# Patient Record
Sex: Female | Born: 1965 | Race: White | Hispanic: No | Marital: Married | Smoking: Current every day smoker
Health system: Southern US, Community
[De-identification: ages and names within clinical notes are randomized; demographics above are authoritative.]

## PROBLEM LIST (undated history)

## (undated) DIAGNOSIS — D649 Anemia, unspecified: Secondary | ICD-10-CM

## (undated) DIAGNOSIS — N939 Abnormal uterine and vaginal bleeding, unspecified: Secondary | ICD-10-CM

## (undated) HISTORY — PX: NO PAST SURGERIES: SHX2092

---

## 2002-10-23 ENCOUNTER — Other Ambulatory Visit: Admission: RE | Admit: 2002-10-23 | Discharge: 2002-10-23 | Payer: Self-pay | Admitting: Obstetrics and Gynecology

## 2003-06-13 ENCOUNTER — Other Ambulatory Visit: Admission: RE | Admit: 2003-06-13 | Discharge: 2003-06-13 | Payer: Self-pay | Admitting: Obstetrics and Gynecology

## 2003-12-20 ENCOUNTER — Inpatient Hospital Stay (HOSPITAL_COMMUNITY): Admission: AD | Admit: 2003-12-20 | Discharge: 2003-12-20 | Payer: Self-pay | Admitting: Obstetrics and Gynecology

## 2003-12-25 ENCOUNTER — Inpatient Hospital Stay (HOSPITAL_COMMUNITY): Admission: AD | Admit: 2003-12-25 | Discharge: 2003-12-28 | Payer: Self-pay | Admitting: Obstetrics and Gynecology

## 2010-11-10 ENCOUNTER — Emergency Department (HOSPITAL_COMMUNITY)
Admission: EM | Admit: 2010-11-10 | Discharge: 2010-11-10 | Disposition: A | Discharge status: Home / Self Care | Payer: No Typology Code available for payment source | Attending: Emergency Medicine | Admitting: Emergency Medicine

## 2010-11-10 ENCOUNTER — Emergency Department (HOSPITAL_COMMUNITY): Payer: No Typology Code available for payment source

## 2010-11-10 DIAGNOSIS — S139XXA Sprain of joints and ligaments of unspecified parts of neck, initial encounter: Secondary | ICD-10-CM | POA: Insufficient documentation

## 2010-11-10 DIAGNOSIS — S335XXA Sprain of ligaments of lumbar spine, initial encounter: Secondary | ICD-10-CM | POA: Insufficient documentation

## 2010-11-10 DIAGNOSIS — Y92410 Unspecified street and highway as the place of occurrence of the external cause: Secondary | ICD-10-CM | POA: Insufficient documentation

## 2010-11-10 DIAGNOSIS — R079 Chest pain, unspecified: Secondary | ICD-10-CM | POA: Insufficient documentation

## 2010-12-07 ENCOUNTER — Emergency Department (HOSPITAL_COMMUNITY)
Admission: EM | Admit: 2010-12-07 | Discharge: 2010-12-07 | Disposition: A | Discharge status: Home / Self Care | Payer: No Typology Code available for payment source | Attending: Emergency Medicine | Admitting: Emergency Medicine

## 2010-12-07 DIAGNOSIS — R51 Headache: Secondary | ICD-10-CM | POA: Insufficient documentation

## 2010-12-07 DIAGNOSIS — R079 Chest pain, unspecified: Secondary | ICD-10-CM | POA: Insufficient documentation

## 2010-12-07 DIAGNOSIS — T148XXA Other injury of unspecified body region, initial encounter: Secondary | ICD-10-CM | POA: Insufficient documentation

## 2010-12-07 DIAGNOSIS — M542 Cervicalgia: Secondary | ICD-10-CM | POA: Insufficient documentation

## 2010-12-07 DIAGNOSIS — M549 Dorsalgia, unspecified: Secondary | ICD-10-CM | POA: Insufficient documentation

## 2011-12-23 ENCOUNTER — Other Ambulatory Visit: Payer: Self-pay | Admitting: Infectious Diseases

## 2011-12-23 ENCOUNTER — Ambulatory Visit
Admission: RE | Admit: 2011-12-23 | Discharge: 2011-12-23 | Disposition: A | Discharge status: Home / Self Care | Payer: No Typology Code available for payment source | Source: Ambulatory Visit | Attending: Infectious Diseases | Admitting: Infectious Diseases

## 2011-12-23 DIAGNOSIS — R7611 Nonspecific reaction to tuberculin skin test without active tuberculosis: Secondary | ICD-10-CM

## 2012-06-28 ENCOUNTER — Encounter: Payer: Self-pay | Admitting: Medical

## 2012-11-06 ENCOUNTER — Other Ambulatory Visit: Payer: Self-pay

## 2012-11-06 DIAGNOSIS — Z1231 Encounter for screening mammogram for malignant neoplasm of breast: Secondary | ICD-10-CM

## 2012-11-12 ENCOUNTER — Ambulatory Visit
Admission: RE | Admit: 2012-11-12 | Discharge: 2012-11-12 | Disposition: A | Discharge status: Home / Self Care | Payer: BC Managed Care – PPO | Source: Ambulatory Visit

## 2012-11-12 DIAGNOSIS — Z1231 Encounter for screening mammogram for malignant neoplasm of breast: Secondary | ICD-10-CM

## 2012-11-15 ENCOUNTER — Ambulatory Visit (INDEPENDENT_AMBULATORY_CARE_PROVIDER_SITE_OTHER): Payer: BC Managed Care – PPO | Admitting: Family Medicine

## 2012-11-15 VITALS — BP 116/72 | HR 63 | Temp 97.8°F | Resp 18 | Ht 63.0 in | Wt 199.0 lb

## 2012-11-15 DIAGNOSIS — Z23 Encounter for immunization: Secondary | ICD-10-CM

## 2012-11-15 DIAGNOSIS — Z0000 Encounter for general adult medical examination without abnormal findings: Secondary | ICD-10-CM

## 2012-11-15 LAB — POCT CBC
Granulocyte percent: 60.2 %G (ref 37–80)
HCT, POC: 43.2 % (ref 37.7–47.9)
Hemoglobin: 13.6 g/dL (ref 12.2–16.2)
MCH, POC: 30.4 pg (ref 27–31.2)
MCV: 96.7 fL (ref 80–97)
RBC: 4.47 M/uL (ref 4.04–5.48)
WBC: 7.5 10*3/uL (ref 4.6–10.2)

## 2012-11-15 NOTE — Progress Notes (Signed)
Subjective:    Patient ID: Meredith Camacho, female    DOB: 09-02-1965, 47 y.o.   MRN: [id number]  HPI Drina Kroeger is a 47 y.o. female No regular primary care provider.  Here for physical.  Last physical about 7 years ago.  No known medical problems, no rx meds.  No specific concerns today.  Fasting this am, except coffee with some sugar free milk this am.  Mammogram negative 11/12/12.  Last tetanus unknown.  Will have done today.  Refuses flu shot - has never had, declines today.  Last pap 4/14 - normal at [facility].  Possible start of menopause earlier this year.   SH: custodian Building control surveyor. Smokes 5 cigarettes per day.  Not ready to quit. No alcohol. From [place] - in [place] for 18 years. 3 children, married.  No known FH of medical problems.  Both parents deceased.  History reviewed. No pertinent past medical history. History reviewed. No pertinent past surgical history. No Known Allergies Prior to Admission medications   Not on File   History   Social History  . Marital Status: Married    Spouse Name: N/A    Number of Children: N/A  . Years of Education: N/A   Occupational History  . Not on file.   Social History Main Topics  . Smoking status: Current Every Day Smoker    Types: Cigarettes  . Smokeless tobacco: Not on file  . Alcohol Use: No  . Drug Use: No  . Sexual Activity: Not on file   Other Topics Concern  . Not on file   Social History Narrative  . No narrative on file    Review of Systems 13 point review of systems per patient health survey noted. No positive responses      Objective:   Physical Exam  Vitals reviewed. Constitutional: She is oriented to person, place, and time. She appears well-developed and well-nourished.  HENT:  Head: Normocephalic and atraumatic.  Right Ear: External ear normal.  Left Ear: External ear normal.  Mouth/Throat: Oropharynx is clear and moist.  Eyes: Conjunctivae are normal. Pupils are equal, round,  and reactive to light.  Neck: Normal range of motion. Neck supple. No thyromegaly present.  Cardiovascular: Normal rate, regular rhythm, normal heart sounds and intact distal pulses.   No murmur heard. Pulmonary/Chest: Effort normal and breath sounds normal. No respiratory distress. She has no wheezes.  Abdominal: Soft. Bowel sounds are normal. There is no tenderness.  Musculoskeletal: Normal range of motion. She exhibits no edema and no tenderness.  Lymphadenopathy:    She has no cervical adenopathy.  Neurological: She is alert and oriented to person, place, and time.  Skin: Skin is warm and dry. No rash noted.  Psychiatric: She has a normal mood and affect. Her behavior is normal. Thought content normal.        Assessment & Plan:  Meredith Camacho is a 47 y.o. female Annual physical exam - Plan: Comprehensive metabolic panel, Lipid panel, TSH, POCT CBC  Need for prophylactic vaccination with combined diphtheria-tetanus-pertussis (DTP) vaccine annual exam. Up to date on MMG and PAP.  Labs above, tdap updated. Declined sti testing, declined flu vaccine - discussed concerns and common misconceptions, and recommended this as working in school. Smoking cessation recommended. Anticipatory guidance and resources in AVS.   Patient Instructions  You should receive a call or letter about your lab results within the next week to 10 days.  When you are ready to quit smoking -  [place] offers smoking cessation clinics. Registration is required. To register call [phone] or register online at [email or web address]   Keeping You Healthy  Get These Tests 1. Blood Pressure- Have your blood pressure checked once a year by your health care provider.  Normal blood pressure is 120/80. 2. Weight- Have your body mass index (BMI) calculated to screen for obesity.  BMI is measure of body fat based on height and weight.  You can also calculate your own BMI at [email or web address].west-esparza.com/. 3. Cholesterol- Have your  cholesterol checked every 5 years starting at age 34 then yearly starting at age 70. 4. Chlamydia, HIV, and other sexually transmitted diseases- Get screened every year until age 62, then within three months of each new sexual provider. 5. Pap Smear- Every 1-3 years; discuss with your health care provider. 6. Mammogram- Every year starting at age 69  Take these medicines  Calcium with Vitamin D-Your body needs 1200 mg of Calcium each day and [phone] IU of Vitamin D daily.  Your body can only absorb 500 mg of Calcium at a time so Calcium must be taken in 2 or 3 divided doses throughout the day.  Multivitamin with folic acid- Once daily if it is possible for you to become pregnant.  Get these Immunizations  Gardasil-Series of three doses; prevents HPV related illness such as genital warts and cervical cancer.  Menactra-Single dose; prevents meningitis.  Tetanus shot- Every 10 years.  Flu shot-Every year.if any questions on this vaccine - please let me know.   Take these steps 1. Do not smoke-Your healthcare provider can help you quit.  For tips on how to quit go to www.smokefree.gov or call 1-800 QUITNOW. 2. Be physically active- Exercise 5 days a week for at least 30 minutes.  If you are not already physically active, start slow and gradually work up to 30 minutes of moderate physical activity.  Examples of moderate activity include walking briskly, dancing, swimming, bicycling, etc. 3. Breast Cancer- A self breast exam every month is important for early detection of breast cancer.  For more information and instruction on self breast exams, ask your healthcare provider or [email or web address]. 4. Eat a healthy diet- Eat a variety of healthy foods such as fruits, vegetables, whole grains, low fat milk, low fat cheeses, yogurt, lean meats, poultry and fish, beans, nuts, tofu, etc.  For more information go to www. [employer]r[facility]ource.org 5. Drink alcohol in moderation-  Limit alcohol intake to one drink or less per day. Never drink and drive. 6. Depression- Your emotional health is as important as your physical health.  If you're feeling down or losing interest in things you normally enjoy please talk to your healthcare provider about being screened for depression. 7. Dental visit- Brush and floss your teeth twice daily; visit your dentist twice a year. 8. Eye doctor- Get an eye exam at least every 2 years. 9. Helmet use- Always wear a helmet when riding a bicycle, motorcycle, rollerblading or skateboarding. 10. Safe sex- If you may be exposed to sexually transmitted infections, use a condom. 11. Seat belts- Seat belts can save your live; always wear one. 12. Smoke/Carbon Monoxide detectors- These detectors need to be installed on the appropriate level of your home. Replace batteries at least once a year. 13. Skin cancer- When out in the sun please cover up and use sunscreen 15 SPF or higher. 14. Violence- If anyone is threatening or hurting you, please tell your healthcare provider.

## 2012-11-15 NOTE — Patient Instructions (Addendum)
You should receive a call or letter about your lab results within the next week to 10 days.  When you are ready to quit smoking - [place] offers smoking cessation clinics. Registration is required. To register call [phone] or register online at [email or web address]   Keeping You Healthy  Get These Tests 1. Blood Pressure- Have your blood pressure checked once a year by your health care provider.  Normal blood pressure is 120/80. 2. Weight- Have your body mass index (BMI) calculated to screen for obesity.  BMI is measure of body fat based on height and weight.  You can also calculate your own BMI at [email or web address].west-esparza.com/. 3. Cholesterol- Have your cholesterol checked every 5 years starting at age 63 then yearly starting at age 76. 4. Chlamydia, HIV, and other sexually transmitted diseases- Get screened every year until age 72, then within three months of each new sexual provider. 5. Pap Smear- Every 1-3 years; discuss with your health care provider. 6. Mammogram- Every year starting at age 56  Take these medicines  Calcium with Vitamin D-Your body needs 1200 mg of Calcium each day and [phone] IU of Vitamin D daily.  Your body can only absorb 500 mg of Calcium at a time so Calcium must be taken in 2 or 3 divided doses throughout the day.  Multivitamin with folic acid- Once daily if it is possible for you to become pregnant.  Get these Immunizations  Gardasil-Series of three doses; prevents HPV related illness such as genital warts and cervical cancer.  Menactra-Single dose; prevents meningitis.  Tetanus shot- Every 10 years.  Flu shot-Every year.if any questions on this vaccine - please let me know.   Take these steps 1. Do not smoke-Your healthcare provider can help you quit.  For tips on how to quit go to www.smokefree.gov or call 1-800 QUITNOW. 2. Be physically active- Exercise 5 days a week for at least 30 minutes.  If you are not already physically active, start slow and  gradually work up to 30 minutes of moderate physical activity.  Examples of moderate activity include walking briskly, dancing, swimming, bicycling, etc. 3. Breast Cancer- A self breast exam every month is important for early detection of breast cancer.  For more information and instruction on self breast exams, ask your healthcare provider or [email or web address]. 4. Eat a healthy diet- Eat a variety of healthy foods such as fruits, vegetables, whole grains, low fat milk, low fat cheeses, yogurt, lean meats, poultry and fish, beans, nuts, tofu, etc.  For more information go to www. Then[facility]ource.org 5. Drink alcohol in moderation- Limit alcohol intake to one drink or less per day. Never drink and drive. 6. Depression- Your emotional health is as important as your physical health.  If you're feeling down or losing interest in things you normally enjoy please talk to your healthcare provider about being screened for depression. 7. Dental visit- Brush and floss your teeth twice daily; visit your dentist twice a year. 8. Eye doctor- Get an eye exam at least every 2 years. 9. Helmet use- Always wear a helmet when riding a bicycle, motorcycle, rollerblading or skateboarding. 10. Safe sex- If you may be exposed to sexually transmitted infections, use a condom. 11. Seat belts- Seat belts can save your live; always wear one. 12. Smoke/Carbon Monoxide detectors- These detectors need to be installed on the appropriate level of your home. Replace batteries at least once a year. 13. Skin cancer- When out in the sun please cover up and use sunscreen  15 SPF or higher. 14. Violence- If anyone is threatening or hurting you, please tell your healthcare provider.

## 2012-11-16 LAB — COMPREHENSIVE METABOLIC PANEL
ALT: 28 U/L (ref 0–35)
AST: 21 U/L (ref 0–37)
Alkaline Phosphatase: 84 U/L (ref 39–117)
BUN: 9 mg/dL (ref 6–23)
Calcium: 9.6 mg/dL (ref 8.4–10.5)
Chloride: 104 mEq/L (ref 96–112)
Creat: 0.6 mg/dL (ref 0.50–1.10)
Potassium: 4.4 mEq/L (ref 3.5–5.3)

## 2012-11-16 LAB — TSH: TSH: 0.86 u[IU]/mL (ref 0.350–4.500)

## 2012-11-16 LAB — LIPID PANEL
HDL: 43 mg/dL (ref >39)
LDL Cholesterol: 107 mg/dL — ABNORMAL HIGH (ref 0–99)
Total CHOL/HDL Ratio: 4.3 Ratio

## 2012-11-26 ENCOUNTER — Telehealth: Payer: Self-pay | Admitting: Radiology

## 2012-11-26 NOTE — Telephone Encounter (Signed)
Patient advised of labs/ advised letter was mailed to her.

## 2013-05-14 ENCOUNTER — Ambulatory Visit (INDEPENDENT_AMBULATORY_CARE_PROVIDER_SITE_OTHER): Payer: No Typology Code available for payment source | Admitting: Family Medicine

## 2013-05-14 VITALS — BP 110/78 | HR 63 | Temp 97.9°F | Resp 16 | Ht 62.0 in | Wt 199.2 lb

## 2013-05-14 DIAGNOSIS — E781 Pure hyperglyceridemia: Secondary | ICD-10-CM

## 2013-05-14 DIAGNOSIS — B351 Tinea unguium: Secondary | ICD-10-CM

## 2013-05-14 DIAGNOSIS — Z79899 Other long term (current) drug therapy: Secondary | ICD-10-CM

## 2013-05-14 LAB — COMPLETE METABOLIC PANEL WITH GFR
ALK PHOS: 74 U/L (ref 39–117)
ALT: 15 U/L (ref 0–35)
AST: 16 U/L (ref 0–37)
Albumin: 4.5 g/dL (ref 3.5–5.2)
BILIRUBIN TOTAL: 0.3 mg/dL (ref 0.2–1.2)
BUN: 9 mg/dL (ref 6–23)
CO2: 22 mEq/L (ref 19–32)
Calcium: 9.5 mg/dL (ref 8.4–10.5)
Chloride: 103 mEq/L (ref 96–112)
Creat: 0.65 mg/dL (ref 0.50–1.10)
GFR, Est African American: >89 mL/min
GFR, Est Non African American: >89 mL/min
Glucose, Bld: 91 mg/dL (ref 70–99)
Potassium: 4.4 mEq/L (ref 3.5–5.3)
SODIUM: 140 meq/L (ref 135–145)
TOTAL PROTEIN: 6.9 g/dL (ref 6.0–8.3)

## 2013-05-14 LAB — LIPID PANEL
CHOL/HDL RATIO: 4.7 ratio
Cholesterol: 197 mg/dL (ref 0–200)
HDL: 42 mg/dL (ref >39)
LDL CALC: 102 mg/dL — AB (ref 0–99)
TRIGLYCERIDES: 264 mg/dL — AB (ref <150)
VLDL: 53 mg/dL — ABNORMAL HIGH (ref 0–40)

## 2013-05-14 MED ORDER — TERBINAFINE HCL 250 MG PO TABS: 250.0000 mg | ORAL_TABLET | Freq: Every day | ORAL | Status: DC

## 2013-05-14 NOTE — Progress Notes (Signed)
Subjective:    Patient ID: Meredith Camacho, female    DOB: [birth year], 48 y.o.   MRN: [id number]  This chart was scribed for [name], MD by Blanchard Kelch, ED Scribe.  Chief Complaint  Patient presents with  . toe issue    both feet the big toe is a yellowish color, concerned it might be a fungus, x2 months  . cholesterol check    PCP: No primary provider on file.   HPI  Meredith Camacho is a 48 y.o. female who presents to office. Last seen by me 10/2012 for a physical. Slightly elevated triglycerides at 168 and LDL at 107 but may not have been fasting completely that day. Plan on fasting recheck labs today.  Hyperlipidemia: She has had had a cup of coffee with fat-free milk this morning. She denies consuming any food this morning. She states that she last ate at 3 PM yesterday.   Great toe discoloration, bilateral: She states that her great toes have been discolored to a yellow color for about two months. She denies using any OTC medication or ointment for the discoloration. She denies any redness or pain with the discoloration.   There are no active problems to display for this patient.  History reviewed. No pertinent past medical history. History reviewed. No pertinent past surgical history. No Known Allergies Prior to Admission medications   Not on File   History   Social History  . Marital Status: Married    Spouse Name: N/A    Number of Children: N/A  . Years of Education: N/A   Occupational History  . Not on file.   Social History Main Topics  . Smoking status: Current Every Day Smoker    Types: Cigarettes  . Smokeless tobacco: Not on file  . Alcohol Use: No  . Drug Use: No  . Sexual Activity: Not on file   Other Topics Concern  . Not on file   Social History Narrative  . No narrative on file      Review of Systems  Constitutional: Negative for fever.  HENT: Negative for drooling.   Eyes: Negative for discharge.  Respiratory: Negative for  cough.   Cardiovascular: Negative for leg swelling.  Gastrointestinal: Negative for vomiting and abdominal pain.  Endocrine: Negative for polyuria.  Genitourinary: Negative for hematuria.  Musculoskeletal: Negative for gait problem.  Skin: Negative for rash.       Positive for great toe discoloration, bilaterally.  Allergic/Immunologic: Negative for immunocompromised state.  Neurological: Negative for speech difficulty.  Hematological: Negative for adenopathy.  Psychiatric/Behavioral: Negative for confusion.       Objective:   Physical Exam  Nursing note and vitals reviewed. Constitutional: She is oriented to person, place, and time. She appears well-developed and well-nourished. No distress.  HENT:  Head: Normocephalic and atraumatic.  Eyes: EOM are normal.  Neck: Neck supple. No tracheal deviation present.  Cardiovascular: Normal rate and regular rhythm.  Exam reveals no gallop and no friction rub.   No murmur heard. Pulmonary/Chest: Effort normal and breath sounds normal. No respiratory distress. She has no wheezes. She has no rales.  Abdominal: Soft. There is no tenderness.  Musculoskeletal: Normal range of motion.  Neurological: She is alert and oriented to person, place, and time.  Skin: Skin is warm and dry.  Great toes bilaterally and left greater than right fifth toe are thickened, discolored yellow. No redness.   Psychiatric: She has a normal mood and affect. Her behavior is normal.  Filed Vitals:   05/14/13 0908  BP: 110/78  Pulse: 63  Temp: 97.9 F (36.6 C)  TempSrc: Oral  Resp: 16  Height: 5\' 2"  (1.575 m)  Weight: 199 lb 3.2 oz (90.357 kg)  SpO2: 97%       Assessment & Plan:   Meredith Camacho is a 48 y.o. female Onychomycosis - Plan: terbinafine (LAMISIL) 250 MG tablet - options discussed, will try Lamisil - 2 month initial Rx, baseline CMP today, LFT's in 6 weeks. Then can call in last rx for Lamisil.  Discussed may require longer treatment.    Hypertriglyceridemia - Plan: COMPLETE METABOLIC PANEL WITH GFR, Lipid panel - borderline levels prior.   High risk medication use - Plan: COMPLETE METABOLIC PANEL WITH GFR, Hepatic Function Panel. As above.    Meds ordered this encounter  Medications  . terbinafine (LAMISIL) 250 MG tablet    Sig: Take 1 tablet (250 mg total) by mouth daily.    Dispense:  30 tablet    Refill:  1   Patient Instructions  Start Lamisil one per day for fungus in nails - see more info below. Return for lab only visit in 6 weeks to look at liver tests.  Return to the clinic or go to the nearest emergency room if any of your symptoms worsen or new symptoms occur. You should receive a call or letter about your lab results within the next week to 10 days.           I personally performed the services described in this documentation, which was scribed in my presence. The recorded information has been reviewed and considered, and addended by me as needed.

## 2013-05-14 NOTE — Patient Instructions (Addendum)
Start Lamisil one per day for fungus in nails - see more info below from [facility]. Return for lab only visit in 6 weeks to look at liver tests.  Return to the clinic or go to the nearest emergency room if any of your symptoms worsen or new symptoms occur. You should receive a call or letter about your lab results within the next week to 10 days.      Onychomycosis/Fungal Toenails  WHAT IS IT? An infection that lies within the keratin of your nail plate that is caused by a fungus.  WHY ME? Fungal infections affect all ages, sexes, races, and creeds.  There may be many factors that predispose you to a fungal infection such as age, coexisting medical conditions such as diabetes, or an autoimmune disease; stress, medications, fatigue, genetics, etc.  Bottom line: fungus thrives in a warm, moist environment and your shoes offer such a location.  IS IT CONTAGIOUS? Theoretically, yes.  You do not want to share shoes, nail clippers or files with someone who has fungal toenails.  Walking around barefoot in the same room or sleeping in the same bed is unlikely to transfer the organism.  It is important to realize, however, that fungus can spread easily from one nail to the next on the same foot.  HOW DO WE TREAT THIS?  There are several ways to treat this condition.  Treatment may depend on many factors such as age, medications, pregnancy, liver and kidney conditions, etc.  It is best to ask your doctor which options are available to you.  1. No treatment.   Unlike many other medical concerns, you can live with this condition.  However for many people this can be a painful condition and may lead to ingrown toenails or a bacterial infection.  It is recommended that you keep the nails cut short to help reduce the amount of fungal nail. 2. Topical treatment.  These range from herbal remedies to prescription strength nail lacquers.  About 40-50% effective, topicals require twice daily application for  approximately 9 to 12 months or until an entirely new nail has grown out.  The most effective topicals are medical grade medications available through physicians offices. 3. Oral antifungal medications.  With an 80-90% cure rate, the most common oral medication requires 3 to 4 months of therapy and stays in your system for a year as the new nail grows out.  Oral antifungal medications do require blood work to make sure it is a safe drug for you.  A liver function panel will be performed prior to starting the medication and after the first month of treatment.  It is important to have the blood work performed to avoid any harmful side effects.  In general, this medication safe but blood work is required. 4. Laser Therapy.  This treatment is performed by applying a specialized laser to the affected nail plate.  This therapy is noninvasive, fast, and non-painful.  It is not covered by insurance and is therefore, out of pocket.  The results have been very good with a 80-95% cure rate.  The [facility] is the only practice in the area to offer this therapy. 5. Permanent Nail Avulsion.  Removing the entire nail so that a new nail will not grow back.

## 2013-09-07 IMAGING — CR DG CHEST 1V
1 series · 1 of 1 positions shown · non-contrast
Comparison: Two-view chest 11/10/2010.

CLINICAL DATA: Positive PPD.

CHEST - 1 VIEW

[view not recorded]
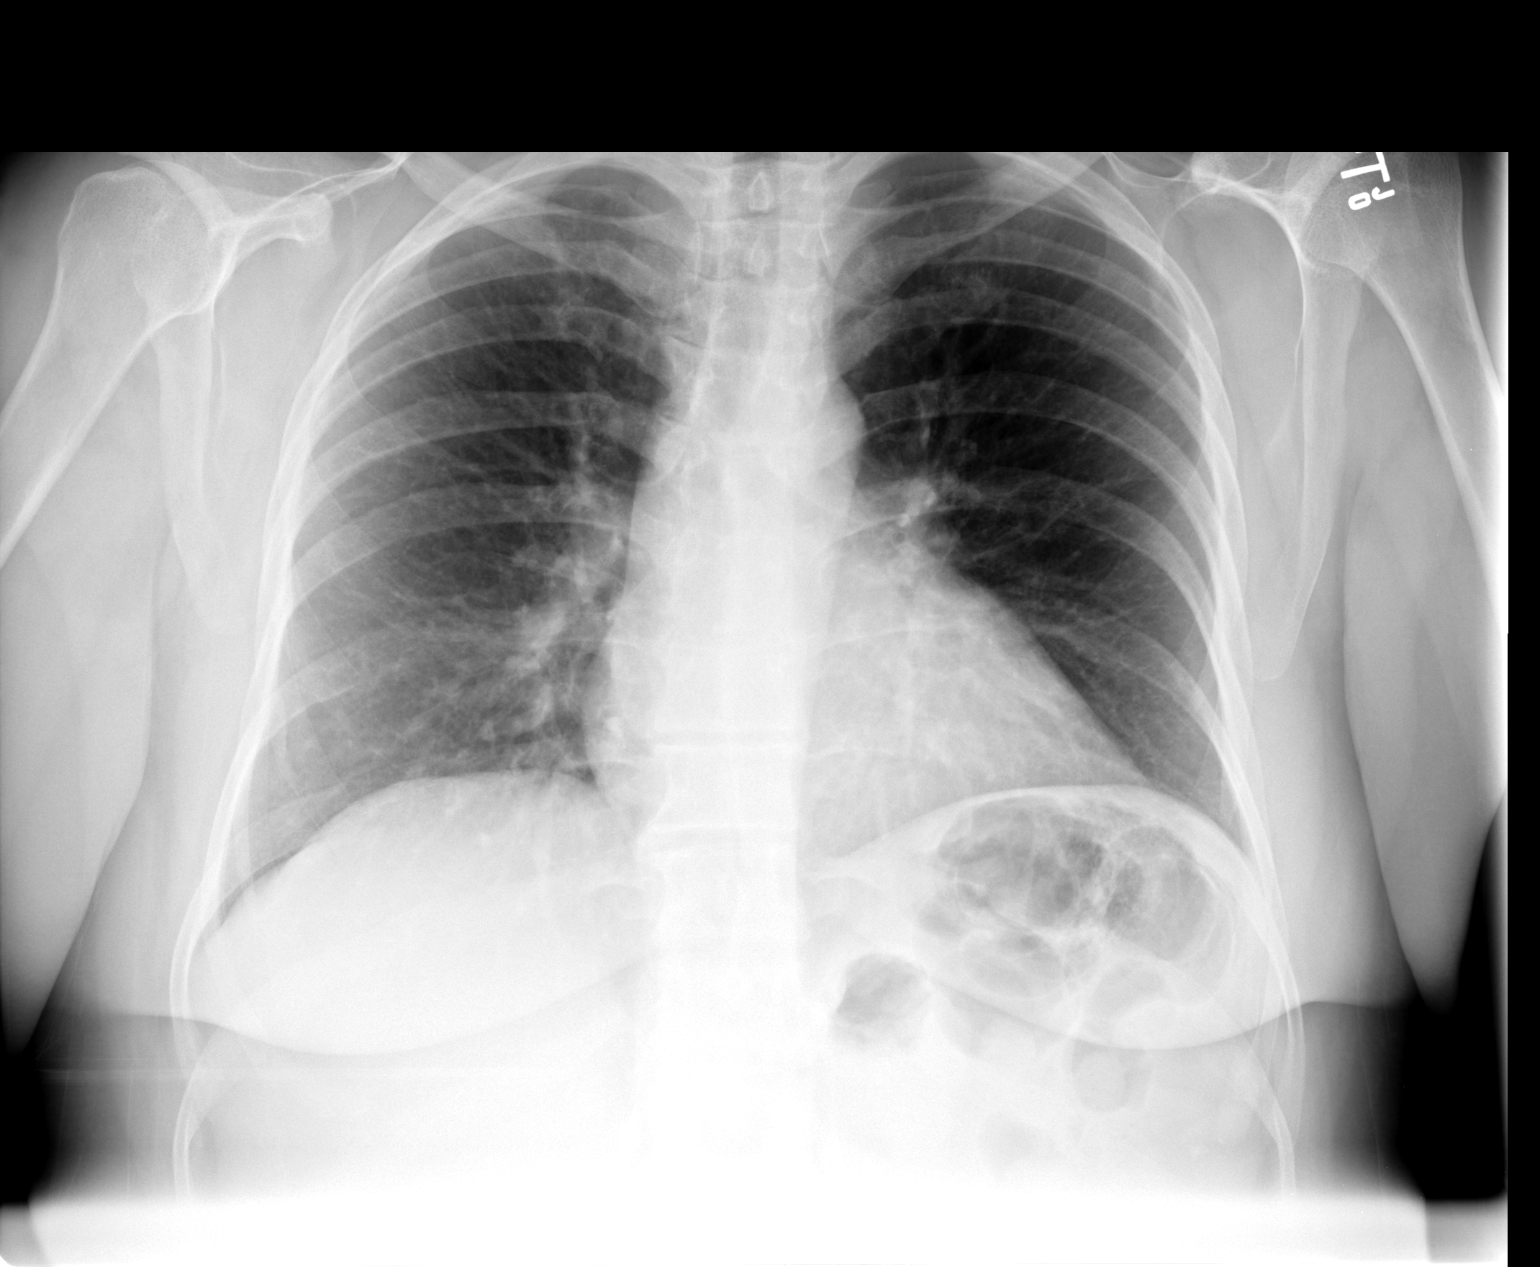

[1 of 1 positions shown; findings below may reference images not displayed]

FINDINGS: The heart size is normal.  The lung volumes are low.
Mild interstitial coarsening is chronic.  The visualized soft
tissues and bony thorax are unremarkable.
IMPRESSION: No acute cardiopulmonary disease or significant interval change.

## 2013-11-06 ENCOUNTER — Other Ambulatory Visit: Payer: Self-pay

## 2013-11-06 DIAGNOSIS — Z1231 Encounter for screening mammogram for malignant neoplasm of breast: Secondary | ICD-10-CM

## 2013-11-13 ENCOUNTER — Ambulatory Visit
Admission: RE | Admit: 2013-11-13 | Discharge: 2013-11-13 | Disposition: A | Discharge status: Home / Self Care | Payer: No Typology Code available for payment source | Source: Ambulatory Visit

## 2013-11-13 DIAGNOSIS — Z1231 Encounter for screening mammogram for malignant neoplasm of breast: Secondary | ICD-10-CM

## 2014-11-11 ENCOUNTER — Other Ambulatory Visit: Payer: Self-pay

## 2014-11-11 DIAGNOSIS — Z1231 Encounter for screening mammogram for malignant neoplasm of breast: Secondary | ICD-10-CM

## 2014-11-18 ENCOUNTER — Ambulatory Visit
Admission: RE | Admit: 2014-11-18 | Discharge: 2014-11-18 | Disposition: A | Discharge status: Home / Self Care | Payer: 59 | Source: Ambulatory Visit

## 2014-11-18 DIAGNOSIS — Z1231 Encounter for screening mammogram for malignant neoplasm of breast: Secondary | ICD-10-CM

## 2014-12-26 ENCOUNTER — Other Ambulatory Visit: Payer: Self-pay | Admitting: Obstetrics and Gynecology

## 2014-12-26 ENCOUNTER — Encounter (HOSPITAL_BASED_OUTPATIENT_CLINIC_OR_DEPARTMENT_OTHER): Payer: Self-pay | Admitting: *Deleted

## 2014-12-26 NOTE — Progress Notes (Signed)
NPO AFTER MN.  ARRIVE AT 1045.  NEEDS CBC AND SERUM PREG.

## 2014-12-28 NOTE — H&P (Signed)
NAMElita Quick:  ,                            ACCOUNT NO.:  [id number]  MEDICAL RECORD NO.:  [id number]  LOCATION:                                 FACILITY:  PHYSICIAN:  Lenoard Adenichard J. Terree Gaultney, M.D.     DATE OF BIRTH:  DATE OF ADMISSION: DATE OF DISCHARGE:                             HISTORY & PHYSICAL   CHIEF COMPLAINT:  Abnormal uterine bleeding with secondary anemia.  HISTORY OF PRESENT ILLNESS:  A 49 year old white female, G5, P3 who presents with abnormal bleeding and secondary anemia, structural lesion on saline sonohysterogram for intervention.  MEDICATIONS:  Include iron and progestin therapy.  ALLERGIES:  She has no known drug allergies.  PAST SURGICAL HISTORY:  History of vaginal delivery x2, C-section x1, abortion x2, history of cholecystectomy.  FAMILY HISTORY:  Noncontributory.  SOCIAL HISTORY:  Noncontributory.  PHYSICAL EXAMINATION:  GENERAL:  She is a well-developed, well- nourished, white female, in no acute distress. HEENT:  Normal. NECK:  Supple.  Full range of motion. LUNGS:  Clear. HEART:  Regular rate and rhythm. ABDOMEN:  Soft, nontender. PELVIC:  Bulky uterus.  No adnexal masses. EXTREMITIES:  There are no cords. NEUROLOGIC:  Nonfocal. SKIN:  Intact.  IMPRESSION:  Abnormal uterine bleeding with structural lesion.  PLAN:  Proceed with diagnostic hysteroscopy, D and C, resectoscope. Risks of anesthesia, infection, bleeding, injury to surrounding organs, possible need for repair was discussed, delayed versus immediate complications to include bowel and bladder injury noted.  The patient acknowledges and wishes to proceed.     Lenoard Adenichard J. Harden Bramer, M.D.     RJT/MEDQ  D:  12/28/2014  T:  12/28/2014  Job:  [id number]

## 2014-12-29 ENCOUNTER — Ambulatory Visit (HOSPITAL_BASED_OUTPATIENT_CLINIC_OR_DEPARTMENT_OTHER): Payer: 59 | Admitting: Anesthesiology

## 2014-12-29 ENCOUNTER — Ambulatory Visit (HOSPITAL_BASED_OUTPATIENT_CLINIC_OR_DEPARTMENT_OTHER)
Admission: RE | Admit: 2014-12-29 | Discharge: 2014-12-29 | Disposition: A | Discharge status: Home / Self Care | Payer: 59 | Source: Ambulatory Visit | Attending: Obstetrics and Gynecology | Admitting: Obstetrics and Gynecology

## 2014-12-29 ENCOUNTER — Encounter (HOSPITAL_BASED_OUTPATIENT_CLINIC_OR_DEPARTMENT_OTHER)
Admission: RE | Disposition: A | Discharge status: Home / Self Care | Payer: Self-pay | Source: Ambulatory Visit | Attending: Obstetrics and Gynecology

## 2014-12-29 ENCOUNTER — Encounter (HOSPITAL_BASED_OUTPATIENT_CLINIC_OR_DEPARTMENT_OTHER): Payer: Self-pay | Admitting: *Deleted

## 2014-12-29 DIAGNOSIS — N939 Abnormal uterine and vaginal bleeding, unspecified: Secondary | ICD-10-CM | POA: Diagnosis not present

## 2014-12-29 DIAGNOSIS — Z79899 Other long term (current) drug therapy: Secondary | ICD-10-CM | POA: Diagnosis not present

## 2014-12-29 DIAGNOSIS — N840 Polyp of corpus uteri: Secondary | ICD-10-CM | POA: Insufficient documentation

## 2014-12-29 DIAGNOSIS — D500 Iron deficiency anemia secondary to blood loss (chronic): Secondary | ICD-10-CM | POA: Diagnosis not present

## 2014-12-29 HISTORY — PX: DILATATION & CURRETTAGE/HYSTEROSCOPY WITH RESECTOCOPE: SHX5572

## 2014-12-29 HISTORY — DX: Anemia, unspecified: D64.9

## 2014-12-29 HISTORY — DX: Abnormal uterine and vaginal bleeding, unspecified: N93.9

## 2014-12-29 LAB — CBC
HEMATOCRIT: 33 % — AB (ref 36.0–46.0)
HEMOGLOBIN: 10.1 g/dL — AB (ref 12.0–15.0)
MCH: 28 pg (ref 26.0–34.0)
MCHC: 30.6 g/dL (ref 30.0–36.0)
MCV: 91.4 fL (ref 78.0–100.0)
Platelets: 299 10*3/uL (ref 150–400)
RBC: 3.61 MIL/uL — AB (ref 3.87–5.11)
RDW: 14.1 % (ref 11.5–15.5)
WBC: 6.6 10*3/uL (ref 4.0–10.5)

## 2014-12-29 LAB — HCG, SERUM, QUALITATIVE: Preg, Serum: NEGATIVE

## 2014-12-29 SURGERY — DILATATION & CURETTAGE/HYSTEROSCOPY WITH RESECTOCOPE
Anesthesia: General

## 2014-12-29 MED ORDER — KETOROLAC TROMETHAMINE 30 MG/ML IJ SOLN
INTRAMUSCULAR | Status: DC | PRN
  Administered 2014-12-29: 30 mg via INTRAVENOUS

## 2014-12-29 MED ORDER — MIDAZOLAM HCL 2 MG/2ML IJ SOLN
INTRAMUSCULAR | Status: AC
  Filled 2014-12-29: qty 2

## 2014-12-29 MED ORDER — MIDAZOLAM HCL 5 MG/5ML IJ SOLN
INTRAMUSCULAR | Status: DC | PRN
  Administered 2014-12-29: 2 mg via INTRAVENOUS

## 2014-12-29 MED ORDER — CEFAZOLIN SODIUM-DEXTROSE 2-3 GM-% IV SOLR
INTRAVENOUS | Status: AC
  Filled 2014-12-29: qty 50

## 2014-12-29 MED ORDER — FENTANYL CITRATE (PF) 100 MCG/2ML IJ SOLN
INTRAMUSCULAR | Status: AC
  Filled 2014-12-29: qty 4

## 2014-12-29 MED ORDER — LIDOCAINE HCL (CARDIAC) 20 MG/ML IV SOLN
INTRAVENOUS | Status: DC | PRN
  Administered 2014-12-29: 100 mg via INTRAVENOUS

## 2014-12-29 MED ORDER — OXYCODONE-ACETAMINOPHEN 5-325 MG PO TABS
1.0000 | ORAL_TABLET | ORAL | Status: DC | PRN
Start: 2014-12-29 — End: 2014-12-29
  Filled 2014-12-29: qty 2

## 2014-12-29 MED ORDER — PROMETHAZINE HCL 25 MG/ML IJ SOLN
6.2500 mg | INTRAMUSCULAR | Status: DC | PRN
  Filled 2014-12-29: qty 1

## 2014-12-29 MED ORDER — DEXAMETHASONE SODIUM PHOSPHATE 4 MG/ML IJ SOLN
INTRAMUSCULAR | Status: DC | PRN
  Administered 2014-12-29: 10 mg via INTRAVENOUS

## 2014-12-29 MED ORDER — ONDANSETRON HCL 4 MG/2ML IJ SOLN
INTRAMUSCULAR | Status: DC | PRN
  Administered 2014-12-29: 4 mg via INTRAVENOUS

## 2014-12-29 MED ORDER — VASOPRESSIN 20 UNIT/ML IV SOLN
INTRAVENOUS | Status: DC | PRN
  Administered 2014-12-29: 20 [IU] via SUBCUTANEOUS

## 2014-12-29 MED ORDER — GLYCINE 1.5 % IR SOLN
Status: DC | PRN
  Administered 2014-12-29: 3300 mL

## 2014-12-29 MED ORDER — CEFAZOLIN SODIUM-DEXTROSE 2-3 GM-% IV SOLR
2.0000 g | INTRAVENOUS | Status: AC
  Administered 2014-12-29: 2 g via INTRAVENOUS
  Filled 2014-12-29: qty 50

## 2014-12-29 MED ORDER — FENTANYL CITRATE (PF) 100 MCG/2ML IJ SOLN
INTRAMUSCULAR | Status: DC | PRN
Start: 2014-12-29 — End: 2014-12-29
  Administered 2014-12-29: 100 ug via INTRAVENOUS

## 2014-12-29 MED ORDER — ACETAMINOPHEN 10 MG/ML IV SOLN
INTRAVENOUS | Status: DC | PRN
  Administered 2014-12-29: 1000 mg via INTRAVENOUS

## 2014-12-29 MED ORDER — BUPIVACAINE HCL 0.25 % IJ SOLN
INTRAMUSCULAR | Status: DC | PRN
  Administered 2014-12-29: 20 mL

## 2014-12-29 MED ORDER — FENTANYL CITRATE (PF) 100 MCG/2ML IJ SOLN
25.0000 ug | INTRAMUSCULAR | Status: DC | PRN
  Filled 2014-12-29: qty 1

## 2014-12-29 MED ORDER — LACTATED RINGERS IV SOLN
INTRAVENOUS | Status: DC
  Administered 2014-12-29 (×2): via INTRAVENOUS
  Filled 2014-12-29: qty 1000

## 2014-12-29 MED ORDER — PROPOFOL 10 MG/ML IV BOLUS
INTRAVENOUS | Status: DC | PRN
  Administered 2014-12-29: 50 mg via INTRAVENOUS
  Administered 2014-12-29: 250 mg via INTRAVENOUS

## 2014-12-29 MED ORDER — SODIUM CHLORIDE 0.9 % IJ SOLN
INTRAMUSCULAR | Status: DC | PRN
  Administered 2014-12-29: 50 mL

## 2014-12-29 MED ORDER — TRAMADOL HCL 50 MG PO TABS
50.0000 mg | ORAL_TABLET | Freq: Four times a day (QID) | ORAL | Status: DC | PRN
  Filled 2014-12-29: qty 1

## 2014-12-29 SURGICAL SUPPLY — 25 items
CANISTER SUCT 3000ML (MISCELLANEOUS) ×2 IMPLANT
CATH ROBINSON RED A/P 16FR (CATHETERS) ×2 IMPLANT
CLOTH BEACON ORANGE TIMEOUT ST (SAFETY) ×2 IMPLANT
CORD ACTIVE DISPOSABLE (ELECTRODE) ×1
CORD ELECTRO ACTIVE DISP (ELECTRODE) IMPLANT
DRAPE LG THREE QUARTER DISP (DRAPES) ×2 IMPLANT
DRSG TELFA 3X8 NADH (GAUZE/BANDAGES/DRESSINGS) ×2 IMPLANT
ELECT LOOP GYNE PRO 24FR (CUTTING LOOP) ×2
ELECT REM PT RETURN 9FT ADLT (ELECTROSURGICAL) ×2
ELECTRODE LOOP GYNE PRO 24FR (CUTTING LOOP) IMPLANT
ELECTRODE REM PT RTRN 9FT ADLT (ELECTROSURGICAL) ×1 IMPLANT
GLOVE BIO SURGEON STRL SZ7.5 (GLOVE) ×2 IMPLANT
GOWN STRL REUS W/ TWL XL LVL3 (GOWN DISPOSABLE) ×3 IMPLANT
GOWN STRL REUS W/TWL XL LVL3 (GOWN DISPOSABLE) ×6
KIT ROOM TURNOVER WOR (KITS) ×2 IMPLANT
LEGGING LITHOTOMY PAIR STRL (DRAPES) IMPLANT
LOOP ANGLED CUTTING 22FR (CUTTING LOOP) IMPLANT
PACK BASIN DAY SURGERY FS (CUSTOM PROCEDURE TRAY) ×2 IMPLANT
PAD DRESSING TELFA 3X8 NADH (GAUZE/BANDAGES/DRESSINGS) ×1 IMPLANT
PAD OB MATERNITY 4.3X12.25 (PERSONAL CARE ITEMS) ×2 IMPLANT
SYR TB 1ML 25GX5/8 (SYRINGE) ×2 IMPLANT
TOWEL OR 17X24 6PK STRL BLUE (TOWEL DISPOSABLE) ×4 IMPLANT
TRAY DSU PREP LF (CUSTOM PROCEDURE TRAY) ×2 IMPLANT
TUBE CONNECTING 12X1/4 (SUCTIONS) IMPLANT
WATER STERILE IRR 500ML POUR (IV SOLUTION) ×2 IMPLANT

## 2014-12-29 NOTE — Progress Notes (Signed)
Patient seen and examined. Consent witnessed and signed. No changes noted. Update completed. 

## 2014-12-29 NOTE — Anesthesia Preprocedure Evaluation (Addendum)
Anesthesia Evaluation  Patient identified by MRN, date of birth, ID band Patient awake    Reviewed: Allergy & Precautions, NPO status , Patient's Chart, lab work & pertinent test results  Airway Mallampati: II  TM Distance: >3 FB Neck ROM: Full    Dental no notable dental hx.    Pulmonary Current Smoker,    Pulmonary exam normal breath sounds clear to auscultation       Cardiovascular negative cardio ROS Normal cardiovascular exam Rhythm:Regular Rate:Normal     Neuro/Psych negative neurological ROS  negative psych ROS   GI/Hepatic negative GI ROS, Neg liver ROS,   Endo/Other  negative endocrine ROS  Renal/GU negative Renal ROS  negative genitourinary   Musculoskeletal negative musculoskeletal ROS (+)   Abdominal (+) + obese,   Peds negative pediatric ROS (+)  Hematology  (+) anemia ,   Anesthesia Other Findings   Reproductive/Obstetrics negative OB ROS                            Anesthesia Physical Anesthesia Plan  ASA: II  Anesthesia Plan: General   Post-op Pain Management:    Induction: Intravenous  Airway Management Planned: LMA  Additional Equipment:   Intra-op Plan:   Post-operative Plan: Extubation in OR  Informed Consent: I have reviewed the patients History and Physical, chart, labs and discussed the procedure including the risks, benefits and alternatives for the proposed anesthesia with the patient or authorized representative who has indicated his/her understanding and acceptance.   Dental advisory given  Plan Discussed with: CRNA  Anesthesia Plan Comments: (Posted anesthesia "choice". Discussed differences between and risks of general and MAC. Questions answered. Plan MAC with GA backup.)      Anesthesia Quick Evaluation

## 2014-12-29 NOTE — Anesthesia Postprocedure Evaluation (Signed)
  Anesthesia Post-op Note  Patient: Meredith Camacho  Procedure(s) Performed: Procedure(s) (LRB): DILATATION & CURETTAGE/HYSTEROSCOPY WITH RESECTOCOPE (N/A)  Patient Location: PACU  Anesthesia Type: General  Level of Consciousness: awake and alert   Airway and Oxygen Therapy: Patient Spontanous Breathing  Post-op Pain: mild  Post-op Assessment: Post-op Vital signs reviewed, Patient's Cardiovascular Status Stable, Respiratory Function Stable, Patent Airway and No signs of Nausea or vomiting  Last Vitals:  Filed Vitals:   12/29/14 1345  BP: 105/71  Pulse: 59  Temp:   Resp: 16    Post-op Vital Signs: stable   Complications: No apparent anesthesia complications

## 2014-12-29 NOTE — Anesthesia Procedure Notes (Signed)
Procedure Name: LMA Insertion Date/Time: 12/29/2014 12:44 PM Performed by: Maris BergerENENNY, Meredith Camacho Pre-anesthesia Checklist: Patient identified, Emergency Drugs available, Suction available and Patient being monitored Patient Re-evaluated:Patient Re-evaluated prior to inductionOxygen Delivery Method: Circle System Utilized Preoxygenation: Pre-oxygenation with 100% oxygen Intubation Type: IV induction Ventilation: Mask ventilation without difficulty LMA: LMA inserted LMA Size: 4.0 Number of attempts: 1 Airway Equipment and Method: Bite block Placement Confirmation: positive ETCO2 Tube secured with: Tape Dental Injury: Teeth and Oropharynx as per pre-operative assessment

## 2014-12-29 NOTE — Discharge Instructions (Signed)

## 2014-12-29 NOTE — Transfer of Care (Signed)
Immediate Anesthesia Transfer of Care Note  Patient: Meredith Camacho  Procedure(s) Performed: Procedure(s): DILATATION & CURETTAGE/HYSTEROSCOPY WITH RESECTOCOPE (N/A)  Patient Location: PACU  Anesthesia Type:General  Level of Consciousness: sedated and patient cooperative  Airway & Oxygen Therapy: Patient Spontanous Breathing and Patient connected to nasal cannula oxygen  Post-op Assessment: Report given to RN and Post -op Vital signs reviewed and stable  Post vital signs: Reviewed and stable  Last Vitals:  Filed Vitals:   12/29/14 1107  BP: 128/75  Pulse: 65  Temp: 36.6 C  Resp: 16    Complications: No apparent anesthesia complications

## 2014-12-30 ENCOUNTER — Encounter (HOSPITAL_BASED_OUTPATIENT_CLINIC_OR_DEPARTMENT_OTHER): Payer: Self-pay | Admitting: Obstetrics and Gynecology

## 2014-12-30 NOTE — Anesthesia Postprocedure Evaluation (Signed)
  Anesthesia Post-op Note  Patient: Meredith Camacho  Procedure(s) Performed: Procedure(s) (LRB): DILATATION & CURETTAGE/HYSTEROSCOPY WITH RESECTOCOPE (N/A)  Patient Location: PACU  Anesthesia Type: General  Level of Consciousness: awake and alert   Airway and Oxygen Therapy: Patient Spontanous Breathing  Post-op Pain: mild  Post-op Assessment: Post-op Vital signs reviewed, Patient's Cardiovascular Status Stable, Respiratory Function Stable, Patent Airway and No signs of Nausea or vomiting  Last Vitals:  Filed Vitals:   12/29/14 1500  BP: 121/70  Pulse: 58  Temp: 36.6 C  Resp: 16    Post-op Vital Signs: stable   Complications: No apparent anesthesia complications

## 2015-01-02 NOTE — Op Note (Signed)
NAMRinaldo Camacho:  Robledo, RISETA                ACCOUNT NO.:  [id number]  MEDICAL RECORD NO.:  [id number]  LOCATION:                                 FACILITY:  PHYSICIAN:  Lenoard Adenichard J. Devery Odwyer, M.D.DATE OF BIRTH:  09-15-1965  DATE OF PROCEDURE:  12/29/2014 DATE OF DISCHARGE:  12/29/2014                              OPERATIVE REPORT   PREOPERATIVE DIAGNOSES:  Abnormal uterine bleeding, secondary anemia, and mass on ultrasound.  POSTOPERATIVE DIAGNOSES:  Abnormal uterine bleeding, secondary anemia, and mass on ultrasound plus a large endometrial polyp.  PROCEDURE:  Diagnostic hysteroscopy, D and C, and resectoscopic polypectomy.  SURGEON:  Lenoard Adenichard J. Anola Mcgough, M.D.  ASSISTANT:  None.  ANESTHESIA:  General.  ESTIMATED BLOOD LOSS:  Less than 50 mL.  FLUID DEFICIT:  200 mL.  COMPLICATIONS:  None.  DRAINAGE:  None.  COUNTS:  Correct.  DISPOSITION:  The patient to recovery in good condition.  BRIEF OPERATIVE NOTE:  After being apprised of risks of anesthesia, infection, bleeding, injury to surrounding organs, possible need for repair, delayed versus immediate complications which include bowel and bladder injury, possible need for repair, the patient was brought to the operating room was administered general anesthetic without complications.  Prepped and draped in usual sterile fashion. Catheterized until the bladder was empty.  Dilute Pitressin solution was placed at 3 and 9 o'clock, dilute Marcaine solution placed in a paracervical block, 20 mL total.  Cervix was easily dilated up to a 31 Pratt dilator.  Hysteroscope was placed.  Visualization revealed a large amount of thickened anterior wall tissue consistent with large endometrial polyp, which was resected without difficulty.  D and C was performed using sharp curettage in a 4-quadrant method.  Cavity appears empty after resection of large polyp and copious amount of tissue, minimal bleeding noted.  All instruments were removed from  cavity after cavity appeared empty.  The patient tolerated the procedure well, was awakened and transferred to recovery in good condition.     Lenoard Adenichard J. Laporsche Hoeger, M.D.     RJT/MEDQ  D:  12/30/2014  T:  12/30/2014  Job:  [id number]

## 2016-02-23 ENCOUNTER — Ambulatory Visit
Admission: RE | Admit: 2016-02-23 | Discharge: 2016-02-23 | Disposition: A | Discharge status: Home / Self Care | Payer: BC Managed Care – PPO | Source: Ambulatory Visit | Attending: Family Medicine | Admitting: Family Medicine

## 2016-02-23 ENCOUNTER — Other Ambulatory Visit: Payer: Self-pay | Admitting: Family Medicine

## 2016-02-23 DIAGNOSIS — Z1231 Encounter for screening mammogram for malignant neoplasm of breast: Secondary | ICD-10-CM

## 2019-02-20 ENCOUNTER — Other Ambulatory Visit (HOSPITAL_COMMUNITY): Payer: Self-pay | Admitting: Family Medicine

## 2019-02-20 DIAGNOSIS — R9431 Abnormal electrocardiogram [ECG] [EKG]: Secondary | ICD-10-CM

## 2019-02-26 ENCOUNTER — Other Ambulatory Visit: Payer: Self-pay

## 2019-02-26 ENCOUNTER — Ambulatory Visit (HOSPITAL_COMMUNITY): Payer: BC Managed Care – PPO | Attending: Cardiovascular Disease

## 2019-02-26 DIAGNOSIS — R9431 Abnormal electrocardiogram [ECG] [EKG]: Secondary | ICD-10-CM | POA: Diagnosis present

## 2019-05-04 ENCOUNTER — Ambulatory Visit: Payer: BC Managed Care – PPO | Attending: Internal Medicine

## 2019-05-04 DIAGNOSIS — Z23 Encounter for immunization: Secondary | ICD-10-CM

## 2019-05-04 NOTE — Progress Notes (Signed)
   Covid-19 Vaccination Clinic  Name:  Meredith Camacho    MRN: [id number] DOB: 22-Feb-1966  05/04/2019  [name] was observed post Covid-19 immunization for 15 minutes without incident. She was provided with Vaccine Information Sheet and instruction to access the V-Safe system.   [name] was instructed to call 911 with any severe reactions post vaccine: Marland Kitchen Difficulty breathing  . Swelling of face and throat  . A fast heartbeat  . A bad rash all over body  . Dizziness and weakness   Immunizations Administered    Name Date Dose VIS Date Route   Pfizer COVID-19 Vaccine 05/04/2019 10:40 AM 0.3 mL 02/08/2019 Intramuscular   Manufacturer: [employer], [employer]   Lot: [id number]   NDC: [address]-2

## 2019-05-25 ENCOUNTER — Ambulatory Visit: Payer: BC Managed Care – PPO | Attending: Internal Medicine

## 2019-05-25 DIAGNOSIS — Z23 Encounter for immunization: Secondary | ICD-10-CM

## 2019-05-25 NOTE — Progress Notes (Signed)
   Covid-19 Vaccination Clinic  Name:  Meredith Camacho    MRN: [id number] DOB: [birth year]  05/25/2019  Meredith Camacho was observed post Covid-19 immunization for 15 minutes without incident. She was provided with Vaccine Information Sheet and instruction to access the V-Safe system.   Meredith Camacho was instructed to call 911 with any severe reactions post vaccine: Marland Kitchen Difficulty breathing  . Swelling of face and throat  . A fast heartbeat  . A bad rash all over body  . Dizziness and weakness   Immunizations Administered    Name Date Dose VIS Date Route   Pfizer COVID-19 Vaccine 05/25/2019 10:27 AM 0.3 mL 02/08/2019 Intramuscular   Manufacturer: [employer], [employer]   Lot: [id number]   NDC: [address]-3

## 2021-05-27 ENCOUNTER — Other Ambulatory Visit: Payer: Self-pay | Admitting: Obstetrics and Gynecology

## 2021-05-27 DIAGNOSIS — N959 Unspecified menopausal and perimenopausal disorder: Secondary | ICD-10-CM

## 2021-11-12 ENCOUNTER — Inpatient Hospital Stay: Admission: RE | Admit: 2021-11-12 | Payer: BC Managed Care – PPO | Source: Ambulatory Visit
